# Patient Record
Sex: Male | Born: 1960 | Race: White | Hispanic: No | Marital: Single | Smoking: Former smoker
Health system: Southern US, Community
[De-identification: ages and names within clinical notes are randomized; demographics above are authoritative.]

## PROBLEM LIST (undated history)

## (undated) DIAGNOSIS — C719 Malignant neoplasm of brain, unspecified: Secondary | ICD-10-CM

## (undated) DIAGNOSIS — R51 Headache: Secondary | ICD-10-CM

## (undated) DIAGNOSIS — R4701 Aphasia: Secondary | ICD-10-CM

## (undated) DIAGNOSIS — I10 Essential (primary) hypertension: Secondary | ICD-10-CM

## (undated) DIAGNOSIS — K219 Gastro-esophageal reflux disease without esophagitis: Secondary | ICD-10-CM

## (undated) DIAGNOSIS — N200 Calculus of kidney: Secondary | ICD-10-CM

## (undated) HISTORY — PX: LUMBAR DISC SURGERY: SHX700

---

## 2003-04-15 ENCOUNTER — Encounter: Payer: Self-pay | Admitting: Orthopaedic Surgery

## 2003-04-15 ENCOUNTER — Ambulatory Visit (HOSPITAL_COMMUNITY): Admission: RE | Admit: 2003-04-15 | Discharge: 2003-04-15 | Payer: Self-pay | Admitting: Orthopaedic Surgery

## 2003-10-15 ENCOUNTER — Ambulatory Visit (HOSPITAL_COMMUNITY): Admission: RE | Admit: 2003-10-15 | Discharge: 2003-10-16 | Payer: Self-pay | Admitting: Orthopaedic Surgery

## 2009-09-03 ENCOUNTER — Emergency Department (HOSPITAL_COMMUNITY): Admission: EM | Admit: 2009-09-03 | Discharge: 2009-09-03 | Payer: Self-pay | Admitting: Emergency Medicine

## 2011-03-18 LAB — URINALYSIS, ROUTINE W REFLEX MICROSCOPIC
Bilirubin Urine: NEGATIVE
Glucose, UA: NEGATIVE mg/dL
Leukocytes, UA: NEGATIVE
Nitrite: NEGATIVE
Protein, ur: NEGATIVE mg/dL
Specific Gravity, Urine: 1.025 (ref 1.005–1.030)
Urobilinogen, UA: 0.2 mg/dL (ref 0.0–1.0)
pH: 6 (ref 5.0–8.0)

## 2011-03-18 LAB — POCT I-STAT, CHEM 8
BUN: 12 mg/dL (ref 6–23)
Calcium, Ion: 1.22 mmol/L (ref 1.12–1.32)
Chloride: 109 mEq/L (ref 96–112)
Creatinine, Ser: 1 mg/dL (ref 0.4–1.5)
Glucose, Bld: 103 mg/dL — ABNORMAL HIGH (ref 70–99)
HCT: 44 % (ref 39.0–52.0)
Hemoglobin: 15 g/dL (ref 13.0–17.0)
Potassium: 3.7 mEq/L (ref 3.5–5.1)
Sodium: 145 mEq/L (ref 135–145)
TCO2: 23 mmol/L (ref 0–100)

## 2011-03-18 LAB — CBC
HCT: 43.9 % (ref 39.0–52.0)
Hemoglobin: 15.3 g/dL (ref 13.0–17.0)
MCHC: 34.8 g/dL (ref 30.0–36.0)
MCV: 91.9 fL (ref 78.0–100.0)
Platelets: 207 10*3/uL (ref 150–400)
RBC: 4.78 MIL/uL (ref 4.22–5.81)
RDW: 12.4 % (ref 11.5–15.5)
WBC: 14.5 10*3/uL — ABNORMAL HIGH (ref 4.0–10.5)

## 2011-03-18 LAB — DIFFERENTIAL
Basophils Absolute: 0 10*3/uL (ref 0.0–0.1)
Basophils Relative: 0 % (ref 0–1)
Eosinophils Absolute: 0 10*3/uL (ref 0.0–0.7)
Eosinophils Relative: 0 % (ref 0–5)
Lymphocytes Relative: 6 % — ABNORMAL LOW (ref 12–46)
Lymphs Abs: 0.9 10*3/uL (ref 0.7–4.0)
Monocytes Absolute: 0.3 10*3/uL (ref 0.1–1.0)
Monocytes Relative: 2 % — ABNORMAL LOW (ref 3–12)
Neutro Abs: 13.3 10*3/uL — ABNORMAL HIGH (ref 1.7–7.7)
Neutrophils Relative %: 92 % — ABNORMAL HIGH (ref 43–77)

## 2011-03-18 LAB — URINE MICROSCOPIC-ADD ON

## 2011-04-29 NOTE — Op Note (Signed)
NAME:  JOB, HOLTSCLAW                           ACCOUNT NO.:  [id number]   MEDICAL RECORD NO.:  [id number]                   PATIENT TYPE:  OIB   LOCATION:  2899                                 FACILITY:  [facility]   PHYSICIAN:  Sharolyn Douglas, M.D.                     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   PROCEDURE:  Left L4-5 microdiskectomy and lateral recess decompression  utilizing the Maxcess retractor system.   SURGEON:  Sharolyn Douglas, M.D.   ASSISTANT:  Verlin Fester, P.A.   ANESTHESIA:  General endotracheal.   COMPLICATIONS:  None.   INDICATIONS:  The patient is a 50 year old male with persistent left L5  radiculopathy secondary to a disk herniation at L4-5 with associated lateral  recess narrowing.  Because his symptoms were refractory to all conservative  care, he elected to undergo microdecompression in hopes of improving his  symptoms.  The risks, benefits, alternatives were extensively discussed and  the patient elected to proceed.   PROCEDURE:  The patient was properly identified in the holding area and  taken to the operating room.  He underwent general endotracheal anesthesia  without difficulty.  He was given prophylactic antibiotics.  He was  carefully positioned on the Wilson frame.  All bony prominences were padded.  Face and eyes were protected at all times.  The back was prepped and draped  in the usual fashion.  We did not use Betadine because of an allergic  history.  Fluoroscopy was brought into the field and we identified the L4-5  level.  Using the lateral imaging, a 1.5 cm incision was made 2 cm off the  midline on the left side directly over the L4-5 disk space.  We then used a  series of dilators to spread the muscle.  We placed the Maxcess retractor.  We removed  a thin layer of muscle directly over the L4-5 interspace and  facet joint.  We again confirmed appropriate position of our retractor over  the L4-5 interspace using fluoroscopy.  The microscope was draped and  brought into the field.  The remainder of the operation was done under  direct microscopic illumination and magnification.   A high speed bur was used to remove the medial one-third of the facet joint  complex as well as the inferior one-third of the L4 lamina.  The ligamentum  flavum was elevated.  We identified the thecal sac and the L5 nerve root.  It was being displaced posteriorly by focal disk bulge.  The nerve root was  gently mobilized medially.  Bleeding was controlled with bipolar  electrocautery and Gelfoam.  The lateral recess was decompressed by removing  the ligamentum flavum  and superior facet overhang.  We then performed sharp  annulotomy.  Several small fragments of disk material were removed.  The  disk space was irrigated using Angiocath irrigation.   We then checked the nerve.  It was completely free from take-off out the  respective foramen using a blunt probe.  Bleeding was controlled with  Gelfoam.  We left 2 mL of Fentanyl in the epidural space for postoperative  anesthesia.  The deep fascia was closed with a #1 Vicryl.  The subcutaneous  layer was closed with 2-0 Vicryl followed by  Dermabond on the skin; 10 cc of local anesthetic was utilized in the  subcutaneous muscle and skin.  The patient was turned supine, extubated  without difficulty and transferred to the recovery room in stable condition,  able to move her upper and lower extremities.                                               Sharolyn Douglas, M.D.    MC/MEDQ  D:  10/15/2003  T:  10/16/2003  Job:  [id number]

## 2011-04-29 NOTE — H&P (Signed)
NAME:  Kevin Galloway, Kevin Galloway                           ACCOUNT NO.:  [id number]   MEDICAL RECORD NO.:  [id number]                   PATIENT TYPE:  OIB   LOCATION:  2899                                 FACILITY:  [facility]   PHYSICIAN:  Sharolyn Douglas, M.D.                     DATE OF BIRTH:  1961/09/27   DATE OF ADMISSION:  10/15/2003  DATE OF DISCHARGE:                                HISTORY & PHYSICAL   CHIEF COMPLAINT:  Back pain and severe left lower extremity pain.   HISTORY OF PRESENT ILLNESS:  The patient is a 50 year old male with back and  left lower extremity pain for approximately 10 months now.  Unfortunately,  his pain has been getting worse despite conservative measures including anti-  inflammatory medications, pain medications, activity modification, and  physical therapy.  His pain has got to the point that it is severely  affecting his activities of daily living and quality of life.  Secondary to  his physical exam findings as well as his MRI findings of HNP at L4/5, it is  felt that the best course of management would be a microdiscectomy.  Risks  and benefits of this procedure were discussed with the patient by Dr. Sharolyn Douglas as well as myself.  They indicated an understanding and opted to  proceed.   ALLERGIES:  PENICILLIN where he has an anaphylactic reaction.  SHELLFISH and  IODINE anaphylactic reaction.  DIMETAPP causes him to be hyper and crazy.   MEDICATIONS:  Tylenol p.r.n.   PAST MEDICAL HISTORY:  Healthy.   PAST SURGICAL HISTORY:  Hand surgery in 1999.   SOCIAL HISTORY:  The patient denies tobacco use and alcohol use.  He is  single.  He works as a Printmaker. His son-in-law will be available to help  during his postoperative course.   FAMILY HISTORY:  Mother alive at age 51 and healthy other than  osteoarthritis.  Father alive in his 85s with a history of TIA.  Otherwise  healthy.   REVIEW OF SYMPTOMS:  The patient denies fevers, chills, sweats, or bleeding  tendencies.  CNS:  Denies blurred vision or double vision, seizures,  headaches, or paralysis.  CARDIOVASCULAR:  Denies chest pain, orthopnea,  angina, claudication, or palpitations.  PULMONARY:  Denies shortness of  breath, productive cough, hemoptysis.  GASTROINTESTINAL:  Denies nausea or  vomiting, constipation, diarrhea, melena, or bloody stools. GENITOURINARY:  Denies dysuria, hematuria, or discharge.  MUSCULOSKELETAL:  As stated in  HPI.   PHYSICAL EXAMINATION:  VITAL SIGNS:  Blood pressure 122/88, respirations 16  and unlabored, pulses 80 and regular.  GENERAL:  The patient is a 50 year old white male who is alert and oriented  and in no acute distress.  Well-nourished and well-groomed.  Appears stated  age.  Pleasant and cooperative to exam.  HEENT:  Head is normocephalic and atraumatic.  Pupils are equal,  round, and  reactive.  Extraocular movements intact.  Nares patent.  Pharynx clear.  NECK:  Supple.  No lymphadenopathy, thyromegaly, or bruits appreciated.  CHEST:  Clear to auscultation bilaterally.  No rales, rhonchi, stridor,  wheezes, or friction rubs.  BREASTS:  Not pertinent and not performed.  HEART:  S1 and S2.  Regular rate and rhythm.  No murmurs, rubs, or gallops  noted.  ABDOMEN:  Soft to palpation.  Positive bowel sounds.  Nontender and  nondistended.  No organomegaly noted.  GENITOURINARY:  Not pertinent and not performed.  EXTREMITIES:  The patient has left lower extremity pain.  His motor function  and sensation are grossly intact.  Pulses are intact and symmetric  bilaterally.  SKIN:  Intact without any lesions or rashes.   LABORATORY DATA:  MRI shows HNP at L4/5.   IMPRESSION:  Herniated nucleus pulposus on lumbar vertebrae-4/5 with left  lumbar vertebrae-5 radiculopathy.   PLAN:  Admit to [facility] on October 15, 2003 for L4/5  microdiscectomy on the left.  This will be done by Dr. Sharolyn Douglas.      Verlin Fester, P.A.                        Sharolyn Douglas, M.D.    CM/MEDQ  D:  10/15/2003  T:  10/15/2003  Job:  [id number]

## 2012-02-13 ENCOUNTER — Ambulatory Visit (INDEPENDENT_AMBULATORY_CARE_PROVIDER_SITE_OTHER): Payer: BC Managed Care – PPO | Admitting: Internal Medicine

## 2012-02-13 VITALS — BP 141/80 | HR 56 | Temp 97.8°F | Resp 16 | Ht 71.0 in | Wt 194.0 lb

## 2012-02-13 DIAGNOSIS — J329 Chronic sinusitis, unspecified: Secondary | ICD-10-CM

## 2012-02-13 DIAGNOSIS — R439 Unspecified disturbances of smell and taste: Secondary | ICD-10-CM

## 2012-02-13 MED ORDER — FLUTICASONE PROPIONATE 50 MCG/ACT NA SUSP: 2.0000 | Freq: Every day | NASAL | Status: DC

## 2012-02-13 MED ORDER — AZITHROMYCIN 250 MG PO TABS: ORAL_TABLET | ORAL | Status: AC

## 2012-02-13 NOTE — Patient Instructions (Signed)

## 2012-02-13 NOTE — Progress Notes (Signed)
  Subjective:    Patient ID: Kevin Galloway, male    DOB: 1961/07/22, 51 y.o.   MRN: [id number]  HPI 51 y/o WM presents complaining of changing sense of smell.  Even perfumes smell like "garbage and bitter".  Denies sinus pressure, no nasal drainage, no ear pain, no fever.  No recent head injury.  No dizziness, no vision changes, no headaches.  History of vertigo.    Also complaining of memory issues.  Can't remember songs like he used to.  Difficulty with word finding.  Trouble with dates.  Aware that he forgets things from time to time.  No family history of Alzheimer's disease or other memory disturbance.    Sleeps 12 hours nightly.  Lives with brother and his wife.      Review of Systems  Constitutional: Negative for fever, chills and fatigue.  HENT: Negative for hearing loss, ear pain, congestion, rhinorrhea, sinus pressure and tinnitus.        Positive for changing sense of smell.  No anosmia.  Neurological: Negative for dizziness, tremors, syncope, light-headedness, numbness and headaches.       Objective:   Physical Exam  Constitutional: He is oriented to person, place, and time. He appears well-developed and well-nourished.  HENT:  Head: Normocephalic and atraumatic.  Right Ear: External ear normal.  Left Ear: External ear normal.       No air/fluid levels  Eyes: Conjunctivae and EOM are normal. Pupils are equal, round, and reactive to light.  Neck: Normal range of motion. Neck supple.  Cardiovascular: Normal rate and regular rhythm.   Pulmonary/Chest: Effort normal and breath sounds normal.  Neurological: He is alert and oriented to person, place, and time.       CN II-XI intact          Assessment & Plan:  Dysosmia.  Likely due to underlying sinusitis triggerred by mold during leaf raking earlier this year.  No concerning or focal signs on examination.  Will treat with antibiotics for 10 days and Flonase for 3 weeks.  If no improvement in symptoms at that time, consider  CT scan of his head to evaluate for mass lesion causing both his dysosmia and memory difficulties.  Memory disturbance.  Likely due to dealing with many tasks at once.  Advised he purchase the book "Where did I leave my glasses?" to read.  He is hyper-aware of his memory disturbance, and can remember that he forgets things from time to time.  We discussed this as a reassuring symptom.   If symptoms persist, or worsen rapidly, he is to return for evaluation.    Discussed and reviewed with Dr. Merla Riches.  E.Ashelyn Mccravy PA-C

## 2012-02-15 ENCOUNTER — Ambulatory Visit (INDEPENDENT_AMBULATORY_CARE_PROVIDER_SITE_OTHER): Payer: BC Managed Care – PPO | Admitting: Family Medicine

## 2012-02-15 DIAGNOSIS — J329 Chronic sinusitis, unspecified: Secondary | ICD-10-CM

## 2012-02-15 DIAGNOSIS — F411 Generalized anxiety disorder: Secondary | ICD-10-CM

## 2012-02-15 DIAGNOSIS — F419 Anxiety disorder, unspecified: Secondary | ICD-10-CM

## 2012-02-15 DIAGNOSIS — D518 Other vitamin B12 deficiency anemias: Secondary | ICD-10-CM

## 2012-02-15 DIAGNOSIS — Z789 Other specified health status: Secondary | ICD-10-CM

## 2012-02-15 DIAGNOSIS — R439 Unspecified disturbances of smell and taste: Secondary | ICD-10-CM

## 2012-02-15 DIAGNOSIS — T887XXA Unspecified adverse effect of drug or medicament, initial encounter: Secondary | ICD-10-CM

## 2012-02-15 DIAGNOSIS — G43909 Migraine, unspecified, not intractable, without status migrainosus: Secondary | ICD-10-CM

## 2012-02-15 NOTE — Progress Notes (Signed)
Subjective: 51 year old white male who was here a couple of days ago when problems from an abnormal smell sensation. He had started a couple weeks ago with a lot of salivation, then developed this abnormal smell. He was seen by the PA and treated with Flonase and Zithromax. He started these yesterday. Last night he developed a severe headache which is unusual for him. He had some flashing light type sensation suggests a severe headache then persisted into the night. He continues to feel washed out today. 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 Not on any other regular medications. He has a history of having had a reaction to a number of meds.  Objective: Eyes PERRLA TMs normal throat clear tiny bit of blood on the mucous inside his left nares neck was supple without significant nodes no carotid bruits chest was clear to auscultation heart regular without murmurs motor strength symmetrical. No gross neurologic deficits noted. The patient is alert and oriented.Very anxious.  Assessment: Headache (probably a migraine variant) which could well be triggered by the Zithromax or Flonase, more likely the Flonase. Abnormal smell sensation, probably related to a sinusitis  Plan: He is best to leave him off of medicines for the most part today. Suggest she use either nasal saline several times daily or if he is feeling more congested in his nose, which is not right now, try using a nettie pot He feels being busy at work best for him , not taking the day off

## 2012-02-15 NOTE — Patient Instructions (Addendum)
Hold the fluticasone and azithromycin. Use NaSal or some other nasal saline spray available over-the-counter.   Try to give it a few days.. If he's not feeling like he is doing better he is to return. In the meanwhile we are trying to get him an appointment with an ENT doctor.   Take 2 tylenol every six hours as needed for headache or pain

## 2012-02-23 ENCOUNTER — Encounter (HOSPITAL_COMMUNITY): Payer: Self-pay | Admitting: *Deleted

## 2012-02-23 ENCOUNTER — Ambulatory Visit (HOSPITAL_COMMUNITY)
Admission: RE | Admit: 2012-02-23 | Discharge: 2012-02-23 | Disposition: A | Discharge status: Home / Self Care | Payer: BC Managed Care – PPO | Source: Ambulatory Visit | Attending: Family Medicine | Admitting: Family Medicine

## 2012-02-23 ENCOUNTER — Ambulatory Visit (INDEPENDENT_AMBULATORY_CARE_PROVIDER_SITE_OTHER): Payer: BC Managed Care – PPO | Admitting: Family Medicine

## 2012-02-23 ENCOUNTER — Inpatient Hospital Stay (HOSPITAL_COMMUNITY)
Admission: EM | Admit: 2012-02-23 | Discharge: 2012-02-28 | DRG: 017 | Disposition: A | Discharge status: Home / Self Care | Payer: BC Managed Care – PPO | Source: Ambulatory Visit | Attending: Neurological Surgery | Admitting: Neurological Surgery

## 2012-02-23 VITALS — BP 126/84 | HR 66 | Temp 98.5°F | Resp 16 | Ht 71.0 in | Wt 191.6 lb

## 2012-02-23 DIAGNOSIS — R471 Dysarthria and anarthria: Secondary | ICD-10-CM | POA: Insufficient documentation

## 2012-02-23 DIAGNOSIS — G9389 Other specified disorders of brain: Secondary | ICD-10-CM | POA: Insufficient documentation

## 2012-02-23 DIAGNOSIS — R430 Anosmia: Secondary | ICD-10-CM

## 2012-02-23 DIAGNOSIS — R4789 Other speech disturbances: Secondary | ICD-10-CM | POA: Diagnosis present

## 2012-02-23 DIAGNOSIS — R439 Unspecified disturbances of smell and taste: Secondary | ICD-10-CM

## 2012-02-23 DIAGNOSIS — R413 Other amnesia: Secondary | ICD-10-CM | POA: Diagnosis present

## 2012-02-23 DIAGNOSIS — J329 Chronic sinusitis, unspecified: Secondary | ICD-10-CM

## 2012-02-23 HISTORY — DX: Malignant neoplasm of brain, unspecified: C71.9

## 2012-02-23 HISTORY — DX: Aphasia: R47.01

## 2012-02-23 HISTORY — DX: Headache: R51

## 2012-02-23 HISTORY — DX: Gastro-esophageal reflux disease without esophagitis: K21.9

## 2012-02-23 HISTORY — DX: Calculus of kidney: N20.0

## 2012-02-23 HISTORY — DX: Essential (primary) hypertension: I10

## 2012-02-23 LAB — POCT CBC
Granulocyte percent: 65.2 %G (ref 37–80)
HCT, POC: 40.7 % — AB (ref 43.5–53.7)
Hemoglobin: 13.5 g/dL — AB (ref 14.1–18.1)
Lymph, poc: 1.9 (ref 0.6–3.4)
MCH, POC: 30.3 pg (ref 27–31.2)
MCHC: 33.2 g/dL (ref 31.8–35.4)
MCV: 91.3 fL (ref 80–97)
MID (cbc): 0.4 (ref 0–0.9)
MPV: 10.3 fL (ref 0–99.8)
POC Granulocyte: 4.2 (ref 2–6.9)
POC LYMPH PERCENT: 29.2 %L (ref 10–50)
POC MID %: 5.6 %M (ref 0–12)
Platelet Count, POC: 192 10*3/uL (ref 142–424)
RBC: 4.46 M/uL — AB (ref 4.69–6.13)
RDW, POC: 13 %
WBC: 6.4 10*3/uL (ref 4.6–10.2)

## 2012-02-23 LAB — POCT SEDIMENTATION RATE: POCT SED RATE: 6 mm/hr (ref 0–22)

## 2012-02-23 MED ORDER — GADOBENATE DIMEGLUMINE 529 MG/ML IV SOLN
18.0000 mL/kg | Freq: Once | INTRAVENOUS | Status: AC
  Administered 2012-02-23: 18 mL via INTRAVENOUS

## 2012-02-23 NOTE — ED Notes (Signed)
The pt does not know why hes here.  He was seen by a doctor today and sent for a c-t scan then back to his doctors office and he was told he has something in his lt head.  No pain he appears slow to answer.

## 2012-02-23 NOTE — Progress Notes (Addendum)
  Subjective:    Patient ID: Kevin Galloway, male    DOB: 1961-11-28, 51 y.o.   MRN: [id number]  HPI  Patient presents in follow up(see office visits of 3/4 and 3/6)  Patient states he began to hyper salivate beginning in January.  This would occur in bursts. Symptoms stopped 2-3 weeks ago after which he developed an unusual scent in his nostrils Patient treated for sinusitis however only took 1 does of azithromycin and flonase  Approximately 10 days ago developed dysarthria which has persisted  Former smoker; quit 1990(smoked 5-6 years); chews tobacco and uses snuff Drinks copious water  SH/ Surveyer  Review of Systems  Constitutional: Negative for fever and chills.  HENT: Negative for neck pain, postnasal drip and ear discharge.   Neurological: Positive for speech difficulty. Negative for dizziness, facial asymmetry and headaches.       Memory loss       Objective:   Physical Exam  Constitutional: He appears well-developed and well-nourished.  HENT:  Head: Normocephalic and atraumatic.  Right Ear: External ear normal.  Left Ear: External ear normal.  Nose: Nose normal.  Mouth/Throat: Oropharynx is clear and moist.  Eyes: EOM are normal. Pupils are equal, round, and reactive to light.  Neck: Neck supple.  Cardiovascular: Normal rate, regular rhythm and normal heart sounds.   Pulmonary/Chest: Effort normal and breath sounds normal.  Neurological: He is alert. He has normal strength. No cranial nerve deficit or sensory deficit.       Dysarthric speech Difficulty with word finding  Skin: Skin is warm.      Results for orders placed in visit on 02/23/12  POCT CBC      Component Value Range   WBC 6.4  4.6 - 10.2 (K/uL)   Lymph, poc 1.9  0.6 - 3.4    POC LYMPH PERCENT 29.2  10 - 50 (%L)   MID (cbc) 0.4  0 - 0.9    POC MID % 5.6  0 - 12 (%M)   POC Granulocyte 4.2  2 - 6.9    Granulocyte percent 65.2  37 - 80 (%G)   RBC 4.46 (*) 4.69 - 6.13 (M/uL)   Hemoglobin 13.5 (*)  14.1 - 18.1 (g/dL)   HCT, POC 65.7 (*) 84.6 - 53.7 (%)   MCV 91.3  80 - 97 (fL)   MCH, POC 30.3  27 - 31.2 (pg)   MCHC 33.2  31.8 - 35.4 (g/dL)   RDW, POC 96.2     Platelet Count, POC 192  142 - 424 (K/uL)   MPV 10.3  0 - 99.8 (fL)  POCT SEDIMENTATION RATE      Component Value Range   POCT SED RATE 6  0 - 22 (mm/hr)       Assessment & Plan:   1. Sinusitis  POCT CBC  2. Dysarthria  POCT SEDIMENTATION RATE, Comprehensive metabolic panel, MR Brain W Contrast, MR Brain W Wo Contrast  3. Anosmia  MR Brain W Contrast, MR Brain W Wo Contrast   Patient to follow up after MRI brain  Call from Dr. 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 Rubin Payor accepted patient in transfer

## 2012-02-24 ENCOUNTER — Encounter (HOSPITAL_COMMUNITY): Payer: Self-pay | Admitting: Emergency Medicine

## 2012-02-24 DIAGNOSIS — C719 Malignant neoplasm of brain, unspecified: Secondary | ICD-10-CM

## 2012-02-24 HISTORY — DX: Malignant neoplasm of brain, unspecified: C71.9

## 2012-02-24 LAB — COMPREHENSIVE METABOLIC PANEL
ALT: 23 U/L (ref 0–53)
AST: 25 U/L (ref 0–37)
Albumin: 4.8 g/dL (ref 3.5–5.2)
Alkaline Phosphatase: 83 U/L (ref 39–117)
BUN: 16 mg/dL (ref 6–23)
CO2: 24 mEq/L (ref 19–32)
Calcium: 10.1 mg/dL (ref 8.4–10.5)
Chloride: 104 mEq/L (ref 96–112)
Creat: 1.26 mg/dL (ref 0.50–1.35)
Glucose, Bld: 107 mg/dL — ABNORMAL HIGH (ref 70–99)
Potassium: 4.5 mEq/L (ref 3.5–5.3)
Sodium: 140 mEq/L (ref 135–145)
Total Bilirubin: 0.6 mg/dL (ref 0.3–1.2)
Total Protein: 6.9 g/dL (ref 6.0–8.3)

## 2012-02-24 LAB — CBC
HCT: 44.1 % (ref 39.0–52.0)
Hemoglobin: 16 g/dL (ref 13.0–17.0)
MCH: 31.6 pg (ref 26.0–34.0)
MCHC: 36.3 g/dL — ABNORMAL HIGH (ref 30.0–36.0)
MCV: 87.2 fL (ref 78.0–100.0)
Platelets: 208 10*3/uL (ref 150–400)
RBC: 5.06 MIL/uL (ref 4.22–5.81)
RDW: 12.2 % (ref 11.5–15.5)
WBC: 8.9 10*3/uL (ref 4.0–10.5)

## 2012-02-24 LAB — DIFFERENTIAL
Basophils Absolute: 0 10*3/uL (ref 0.0–0.1)
Basophils Relative: 0 % (ref 0–1)
Eosinophils Absolute: 0.1 10*3/uL (ref 0.0–0.7)
Eosinophils Relative: 1 % (ref 0–5)
Lymphocytes Relative: 42 % (ref 12–46)
Lymphs Abs: 3.7 10*3/uL (ref 0.7–4.0)
Monocytes Absolute: 0.5 10*3/uL (ref 0.1–1.0)
Monocytes Relative: 5 % (ref 3–12)
Neutro Abs: 4.6 10*3/uL (ref 1.7–7.7)
Neutrophils Relative %: 52 % (ref 43–77)

## 2012-02-24 LAB — BASIC METABOLIC PANEL
BUN: 16 mg/dL (ref 6–23)
CO2: 25 mEq/L (ref 19–32)
Calcium: 10.1 mg/dL (ref 8.4–10.5)
Chloride: 100 mEq/L (ref 96–112)
Creatinine, Ser: 0.99 mg/dL (ref 0.50–1.35)
GFR calc Af Amer: >90 mL/min (ref >90)
GFR calc non Af Amer: >90 mL/min (ref >90)
Glucose, Bld: 107 mg/dL — ABNORMAL HIGH (ref 70–99)
Potassium: 3.6 mEq/L (ref 3.5–5.1)
Sodium: 136 mEq/L (ref 135–145)

## 2012-02-24 MED ORDER — SODIUM CHLORIDE 0.9 % IJ SOLN
3.0000 mL | INTRAMUSCULAR | Status: DC | PRN
  Administered 2012-02-24 – 2012-02-25 (×2): 3 mL via INTRAVENOUS

## 2012-02-24 MED ORDER — PANTOPRAZOLE SODIUM 40 MG PO TBEC
40.0000 mg | DELAYED_RELEASE_TABLET | Freq: Every day | ORAL | Status: DC
  Administered 2012-02-24 – 2012-02-28 (×5): 40 mg via ORAL
  Filled 2012-02-24 (×4): qty 1

## 2012-02-24 MED ORDER — SODIUM CHLORIDE 0.9 % IV SOLN
250.0000 mL | INTRAVENOUS | Status: DC
  Administered 2012-02-24: 250 mL via INTRAVENOUS

## 2012-02-24 MED ORDER — ACETAMINOPHEN 650 MG RE SUPP: 650.0000 mg | RECTAL | Status: DC | PRN

## 2012-02-24 MED ORDER — HYDROMORPHONE HCL PF 1 MG/ML IJ SOLN: 0.5000 mg | INTRAMUSCULAR | Status: DC | PRN

## 2012-02-24 MED ORDER — POTASSIUM CHLORIDE IN NACL 20-0.9 MEQ/L-% IV SOLN
INTRAVENOUS | Status: DC
  Administered 2012-02-24 – 2012-02-25 (×3): via INTRAVENOUS
  Administered 2012-02-26: 1000 mL via INTRAVENOUS
  Administered 2012-02-26 – 2012-02-27 (×3): via INTRAVENOUS
  Filled 2012-02-24 (×10): qty 1000

## 2012-02-24 MED ORDER — DEXAMETHASONE SODIUM PHOSPHATE 4 MG/ML IJ SOLN
4.0000 mg | Freq: Four times a day (QID) | INTRAMUSCULAR | Status: AC
  Administered 2012-02-24 – 2012-02-25 (×4): 4 mg via INTRAVENOUS
  Filled 2012-02-24 (×2): qty 1

## 2012-02-24 MED ORDER — ACETAMINOPHEN 325 MG PO TABS: 650.0000 mg | ORAL_TABLET | ORAL | Status: DC | PRN

## 2012-02-24 MED ORDER — HYDROCODONE-ACETAMINOPHEN 5-325 MG PO TABS: 1.0000 | ORAL_TABLET | ORAL | Status: DC | PRN

## 2012-02-24 MED ORDER — ONDANSETRON HCL 4 MG/2ML IJ SOLN: 4.0000 mg | INTRAMUSCULAR | Status: DC | PRN

## 2012-02-24 MED ORDER — SODIUM CHLORIDE 0.9 % IJ SOLN
3.0000 mL | Freq: Two times a day (BID) | INTRAMUSCULAR | Status: DC
  Administered 2012-02-24 – 2012-02-27 (×5): 3 mL via INTRAVENOUS

## 2012-02-24 NOTE — ED Notes (Signed)
MD at bedside for pt exam.

## 2012-02-24 NOTE — ED Provider Notes (Signed)
History     CSN: [id number]  Arrival date & time 02/23/12  2255   First MD Initiated Contact with Patient 02/24/12 0242      Chief Complaint  Patient presents with  . Headache    (Consider location/radiation/quality/duration/timing/severity/associated sxs/prior treatment) HPI Comments: Patient presents complaining of approximately 2 weeks of word finding difficulties.  He does not have a primary care physician.  Patient went to an urgent care where he was referred for an MRI.  After receiving the results of the MRI the patient was directed here to the emergency department.  Patient was aware that he had some kind of brain tumor but no further information.  Patient denies any ongoing headache or nausea.  No fevers or chills.  He denies any weakness, numbness, dysarthria or other neurologic deficits.  Patient is a 51 y.o. male presenting with headaches. The history is provided by the patient. No language interpreter was used.  Headache  Pertinent negatives include no fever, no shortness of breath, no nausea and no vomiting.    History reviewed. No pertinent past medical history.  History reviewed. No pertinent past surgical history.  History reviewed. No pertinent family history.  History  Substance Use Topics  . Smoking status: Former Research scientist (life sciences)  . Smokeless tobacco: Not on file  . Alcohol Use: Not on file      Review of Systems  Constitutional: Negative.  Negative for fever and chills.  Eyes: Negative.  Negative for discharge and redness.  Respiratory: Negative.  Negative for cough and shortness of breath.   Cardiovascular: Negative.  Negative for chest pain.  Gastrointestinal: Negative.  Negative for nausea, vomiting and abdominal pain.  Genitourinary: Negative.  Negative for hematuria.  Musculoskeletal: Negative.  Negative for back pain.  Skin: Negative.  Negative for color change and rash.  Neurological: Negative for syncope, speech difficulty and headaches.   Expressive aphasia  Hematological: Negative.  Negative for adenopathy.  Psychiatric/Behavioral: Negative.  Negative for confusion.  All other systems reviewed and are negative.    Allergies  Iodine; Penicillins; Shellfish allergy; Dimetapp; Doxycycline; Latex; and Oxycodone  Home Medications   Current Outpatient Rx  Name Route Sig Dispense Refill  . ACETAMINOPHEN 325 MG PO TABS Oral Take 650 mg by mouth every 6 (six) hours as needed. For pain    . SODIUM CHLORIDE 0.65 % NA SOLN Nasal Place 1 spray into the nose daily as needed. For congestion      BP 109/71  Pulse 52  Temp(Src) 97.2 F (36.2 C) (Oral)  Resp 16  SpO2 94%  Physical Exam  Nursing note and vitals reviewed. Constitutional: He is oriented to person, place, and time. He appears well-developed and well-nourished.  Non-toxic appearance. He does not have a sickly appearance.  HENT:  Head: Normocephalic and atraumatic.  Eyes: Conjunctivae, EOM and lids are normal. Pupils are equal, round, and reactive to light.  Neck: Trachea normal, normal range of motion and full passive range of motion without pain. Neck supple.  Cardiovascular: Normal rate, regular rhythm and normal heart sounds.   Pulmonary/Chest: Effort normal and breath sounds normal. No respiratory distress.  Abdominal: Soft. Normal appearance. He exhibits no distension. There is no tenderness. There is no rebound and no CVA tenderness.  Musculoskeletal: Normal range of motion.  Neurological: He is alert and oriented to person, place, and time. He has normal strength.       Cranial nerves II through XII are intact.  Face is symmetric.  Tongue is midline.  Visual fields are intact.  Extraocular eye movements are intact.  No pronator drift.  Normal finger nose testing bilaterally.  Clear speech.  Normal gait.  Patient does have some expressive aphasia in difficulty finding specific words in conversation.  Skin: Skin is warm, dry and intact. No rash noted.    Psychiatric: He has a normal mood and affect. His behavior is normal. Judgment and thought content normal.    ED Course  Procedures (including critical care time)  Labs Reviewed  CBC - Abnormal; Notable for the following:    MCHC 36.3 (*)    All other components within normal limits  BASIC METABOLIC PANEL - Abnormal; Notable for the following:    Glucose, Bld 107 (*)    All other components within normal limits  DIFFERENTIAL   Mr Jeri Cos Wo Contrast  02/23/2012  *RADIOLOGY REPORT*  Clinical Data: Dysarthria.  MRI HEAD WITHOUT AND WITH CONTRAST  Technique:  Multiplanar, multiecho pulse sequences of the brain and surrounding structures were obtained according to standard protocol without and with intravenous contrast  Contrast:  15 ml Multihance  Comparison: None.  Findings: There is a 6 cm mass lesion in the left temporal lobe with areas of enhancement, internal necrosis and internal hemorrhage.  There is regional vasogenic edema.  There is mass effect upon the left lateral ventricle.  There is left to right shift of 8 mm.  There is uncal herniation on the left.  Findings are consistent with malignant glioma, probably glioblastoma multiforme.  No separate or satellite foci are evident.  The right hemisphere is normal.  No hydrocephalus.  No extra-axial fluid collection. No ischemic infarction.  No pituitary mass.  No inflammatory sinus disease.  IMPRESSION: 6 cm mass lesion in the left temporal lobe with mass effect, vasogenic edema, left right shift and early uncal herniation. 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  Critical Value/emergent results were called by telephone at the time of interpretation on 02/23/2012  at 2130 hours   to Dr.Richter, who verbally acknowledged these results.  Original Report Authenticated By: Jules Schick, M.D.     1. Brain mass       MDM  Patient awake and alert with his only neurologic deficits pain work finding difficulties.  I did contact Dr.  Ronnald Ramp from neurosurgery to evaluate the patient given his significant brain mass that is likely glioblastoma.  I did have discussion with the patient regarding his likely diagnosis and that his prognosis is not good.  I informed him that I would be involving neurosurgery and potentially other specialties to sort out a management plan since this patient has no primary care physician as he has no other medical problems at this time.  Dr. Ronnald Ramp did see and evaluate the patient and is admitting him to his service for further management at this time.        Lezlie Octave, MD 02/24/12 [phone]

## 2012-02-24 NOTE — ED Notes (Signed)
Patient stated that he has seen his doctor and he ordered MRI. He did MRI and they sent him straight here after they found a mass of 6cm in his temporal lobe. Patient stated he went to his doctor because his speech was affected by speaking slower and having hard time finding the words to express. Patient denies any pain and is ambulatory with steady gait. He thinks when he tries to find the right words to express and is slow to respond. No other neurodeficits noted

## 2012-02-24 NOTE — H&P (Signed)
Subjective: Patient is a 51 y.o. right handed male admitted with an abnormal brain lesion. Patient's symptoms include affective and/or cognitive changes including: word finding difficulties. Onset of symptoms was gradual starting a few weeks ago with gradually worsening course since that time. He denies headache or numbness tingling or weakness. At first it was felt he had sinus disease and he was given a Z-Pak it appears. He only took the first day. The history is difficult because of his difficulty with word finding. MRI was finally ordered today and he was found to have a left temporal enhancing brain lesion, and he was sent to [place] for further care. Neurosurgical evaluation was requested by the ED P.  History reviewed. No pertinent past medical history.  History reviewed. No pertinent past surgical history.  Allergies  Allergen Reactions  . Iodine Anaphylaxis and Hives    Reaction to shellfish  . Penicillins Anaphylaxis  . Shellfish Allergy Anaphylaxis and Hives  . Dimetapp (Albertsons Di Bromm) Other (See Comments)    Nightmare hallucination   . Doxycycline Nausea And Vomiting  . Latex Itching  . Oxycodone Other (See Comments)    Hallucinations     History  Substance Use Topics  . Smoking status: Former Research scientist (life sciences)  . Smokeless tobacco: Not on file  . Alcohol Use: Not on file    History reviewed. No pertinent family history. Prior to Admission medications   Medication Sig Start Date End Date Taking? Authorizing Provider  acetaminophen (TYLENOL) 325 MG tablet Take 650 mg by mouth every 6 (six) hours as needed. For pain   Yes Historical Provider, MD  sodium chloride (OCEAN) 0.65 % nasal spray Place 1 spray into the nose daily as needed. For congestion   Yes Historical Provider, MD     Review of Systems  Positive ROS: Otherwise negative  All other systems have been reviewed and were otherwise negative with the exception of those mentioned in the HPI and as  above.  Objective: Vital signs in last 24 hours: Temp:  [97.2 F (36.2 C)-98.5 F (36.9 C)] 97.2 F (36.2 C) (03/15 0346) Pulse Rate:  [52-80] 52  (03/15 0346) Resp:  [16] 16  (03/15 0346) BP: (109-139)/(71-88) 109/71 mmHg (03/15 0346) SpO2:  [94 %-95 %] 94 % (03/15 0346) Weight:  [86.909 kg (191 lb 9.6 oz)] 86.909 kg (191 lb 9.6 oz) (03/14 1733)  General Appearance: Alert, cooperative, no distress, appears stated age Head: Normocephalic, without obvious abnormality, atraumatic Eyes: PERRL, conjunctiva/corneas clear, EOM's intact, fundi benign, both eyes      Ears: Normal TM's and external ear canals, both ears Throat: Lips, mucosa, and tongue normal; teeth and gums normal Neck: Supple, symmetrical, trachea midline,  Back: Symmetric, no curvature, ROM normal, no CVA tenderness Lungs: Clear to auscultation bilaterally, respirations unlabored Heart: Regular rate and rhythm, S1 and S2 normal, no murmur, rub or gallop Abdomen: Soft, non-tender, bowel sounds active all four quadrants, no masses, no organomegaly Extremities: Extremities normal, atraumatic, no cyanosis or edema Pulses: 2+ and symmetric all extremities Skin: Skin color, texture, turgor normal, no rashes or lesions  NEUROLOGIC:  Mental status: Awake and alert, conversant but does have some trouble with word finding and mild fluency issues, no trouble with naming or repeating or with comprehension Motor Exam - grossly normal, normal tone/ bulk, no pronator drift Sensory Exam - grossly normal Reflexes: 2+ symmetric, no hoffman's or clonus Coordination - grossly normal Gait - grossly normal Balance - grossly normal Cranial Nerves: I: smell  Not tested  II: visual acuity  OS: nl    OD: nl  II: visual fields Full to confrontation  II: pupils Equal, round, reactive to light  III,VII: ptosis None  III,IV,VI: extraocular muscles  Full ROM  V: mastication Normal  V: facial light touch sensation  Normal  V,VII: corneal  reflex  Present  VII: facial muscle function - upper  Normal  VII: facial muscle function - lower Normal  VIII: hearing Not tested  IX: soft palate elevation  Normal  IX,X: gag reflex Present  XI: trapezius strength  5/5  XI: sternocleidomastoid strength 5/5  XI: neck flexion strength  5/5  XII: tongue strength  Normal    Data Review Lab Results  Component Value Date   WBC 8.9 02/24/2012   HGB 16.0 02/24/2012   HCT 44.1 02/24/2012   MCV 87.2 02/24/2012   PLT 208 02/24/2012   Lab Results  Component Value Date   NA 136 02/24/2012   K 3.6 02/24/2012   CL 100 02/24/2012   CO2 25 02/24/2012   BUN 16 02/24/2012   CREATININE 0.99 02/24/2012   GLUCOSE 107* 02/24/2012   No results found for this basename: INR, PROTIME    Assessment/Plan: Brain lesion (348.8), most consistent with either a GBM or lymphoma. This is a large 6 cm lesion in the dominant temporal lobe unfortunately involves the root of the temporal lobe and goes medial toward the basal ganglia. It is unlikely that this lesion can be completely resected without neurologic deficit including significant potential for aphasia and right hemiparesis. It is unlikely that surgical resection can be curative because he cannot be complete, and it may be that he is not a great surgical candidate and would be best treated with palliative radiation therapy and chemotherapy. I will obtain the opinions of other neurosurgeons.  Consults:radiation oncology will likely be necessary . Testing: PT. Dexamethasone to prevent and decrease any existing swelling of the brain.  I explained the condition and potential surgical and medical options to the patient and answered any questions.    Linell Shawn S 02/24/2012 3:56 AM

## 2012-02-25 ENCOUNTER — Telehealth: Payer: Self-pay

## 2012-02-25 LAB — PROTIME-INR: Prothrombin Time: 13.4 seconds (ref 11.6–15.2)

## 2012-02-25 MED ORDER — DEXAMETHASONE SODIUM PHOSPHATE 4 MG/ML IJ SOLN
6.0000 mg | Freq: Four times a day (QID) | INTRAMUSCULAR | Status: AC
  Administered 2012-02-25 – 2012-02-26 (×3): 6 mg via INTRAVENOUS
  Filled 2012-02-25: qty 2
  Filled 2012-02-25 (×2): qty 1.5

## 2012-02-25 NOTE — Telephone Encounter (Signed)
Pt is wanting to talk with dr Hal Hope she saw him recently and now he is in the hospital -please call [phone]

## 2012-02-25 NOTE — Telephone Encounter (Signed)
To Dr. Hal Hope

## 2012-02-25 NOTE — Progress Notes (Signed)
Filed Vitals:   02/24/12 2200 02/25/12 0200 02/25/12 0600 02/25/12 1004  BP: 107/60 112/70 114/71 155/79  Pulse: 60 68 53 76  Temp: 97.8 F (36.6 C) 97.3 F (36.3 C) 97.8 F (36.6 C) 97.6 F (36.4 C)  TempSrc: Oral Oral Oral Oral  Resp: 20 20 20 19   Height:      Weight:      SpO2: 96% 97% 96% 93%    CBC  Basename 02/24/12 0300 02/23/12 1751  WBC 8.9 6.4  HGB 16.0 13.5*  HCT 44.1 40.7*  PLT 208 --   BMET  Basename 02/24/12 0300 02/23/12 1816  NA 136 140  K 3.6 4.5  CL 100 104  CO2 25 24  GLUCOSE 107* 107*  BUN 16 16  CREATININE 0.99 1.26  CALCIUM 10.1 10.1    Patient resting comfortably in bed. He denies headache. His greatest complaint is his speech and language difficulties. However his mother notes that he does have difficulties with his memory as well.  On exam the patient is awake and alert following commands, moving all extremities well. He has significant expressive language difficulties, but does seem to understand well.   Plan: I met with the patient and his family including his mother, brother's, sister-in-law and other family members. I spoke with them for over 15 minutes about the nature of his condition, the findings of his MRI scan, and possible options for treatment and care. I emphasized to them that final recommendations will need to be made between the patient, his family, and Dr. Sherley Bounds his treating physician, and I emphasized to them that I am covering for Dr. Ronnald Ramp over this weekend but in the end treatment decisions will need to be made by him in the coming week. I did discuss with them the probable primary nature of this tumor, and the likelihood that this represents a GBM. We discussed options for treatment ranging from biopsy to surgical resection as well as adjuvant treatments of radiation therapy and chemotherapy. Their many questions were answered for them.  Hosie Spangle, MD 02/25/2012, 11:57 AM

## 2012-02-26 NOTE — Progress Notes (Signed)
Filed Vitals:   02/26/12 0113 02/26/12 0328 02/26/12 0633 02/26/12 1024  BP: 111/65 112/75 98/57 114/71  Pulse: 51 55 67 67  Temp: 97.9 F (36.6 C) 97.4 F (36.3 C) 97.7 F (36.5 C) 97.9 F (36.6 C)  TempSrc: Oral Oral Oral Oral  Resp: 16 16 16 18   Height:      Weight:      SpO2: 96% 95% 97% 94%    Patient resting comfortably in bed. Continues to have speech and language difficulty.  On exam he is awake and alert, following commands, moving all extremities well. He does continue to have expressive language difficulty.  Plan: I've encouraged the patient along with his family to have him including the halls at least 4-5 times per day. He is looking forward to discussing tomorrow with Dr. Yetta Barre plans for further evaluation and intervention. He does continue on Decadron at this time.  Hewitt Shorts, MD 02/26/2012, 10:50 AM

## 2012-02-27 NOTE — Progress Notes (Signed)
Patient ID: Kevin Galloway, male   DOB: Nov 18, 1961, 51 y.o.   MRN: [id number] Subjective: Patient reports no headache. Still some difficulty with speech...unchanged.  Objective: Vital signs in last 24 hours: Temp:  [97.6 F (36.4 C)-98.6 F (37 C)] 97.6 F (36.4 C) (03/18 0600) Pulse Rate:  [54-75] 57  (03/18 0600) Resp:  [18] 18  (03/18 0600) BP: (96-130)/(53-84) 113/73 mmHg (03/18 0600) SpO2:  [87 %-99 %] 99 % (03/18 0600)  Intake/Output from previous day: 03/17 0701 - 03/18 0700 In: 900 [I.V.:900] Out: -  Intake/Output this shift:    Neurologic: Grossly normal except mild difficulty with speech  Lab Results: Lab Results  Component Value Date   WBC 8.9 02/24/2012   HGB 16.0 02/24/2012   HCT 44.1 02/24/2012   MCV 87.2 02/24/2012   PLT 208 02/24/2012   Lab Results  Component Value Date   INR 1.00 02/24/2012   BMET Lab Results  Component Value Date   NA 136 02/24/2012   K 3.6 02/24/2012   CL 100 02/24/2012   CO2 25 02/24/2012   GLUCOSE 107* 02/24/2012   BUN 16 02/24/2012   CREATININE 0.99 02/24/2012   CALCIUM 10.1 02/24/2012    Studies/Results: No results found.  Assessment/Plan: Had long d/w he and his family re: prognosis and treatment options. Talked with Dr. Tempie Donning at [facility] who vast experience in awake procedures with brain mapping. This may be best option for [name]. Awaiting further word from dr. Tempie Donning.   LOS: 4 days    Danon Lograsso S 02/27/2012, 10:48 AM

## 2012-02-27 NOTE — Telephone Encounter (Signed)
Spoke with patient for 20 minutes. 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 Dysarthric with difficulty finding words during conversation. I will visit patient this week

## 2012-02-28 MED ORDER — DEXAMETHASONE 0.75 MG PO TABS: 4.0000 mg | ORAL_TABLET | Freq: Three times a day (TID) | ORAL | Status: AC

## 2012-02-28 NOTE — Discharge Summary (Signed)
Physician Discharge Summary  Patient ID: Kevin Galloway MRN: [id number] DOB/AGE: 1960-12-15 51 y.o.  Admit date: 02/23/2012 Discharge date: 02/28/2012  Admission Diagnoses: L temporal Brain mass    Discharge Diagnoses: same   Discharged Condition: stable  Hospital Course: The patient was admitted on 02/23/2012 with a left temporal brain mass. He has difficulty with speech, but otherwise had a non-focal exam. The hospital course was routine.  The patient remained afebrile with stable vital signs, and tolerated a regular diet. The patient continued to increase activities, and was treated with decadron. I felt he likely needed treatment at a tertiary care facility where awake surgery with spreech mapping may be performed if necessary to achieve resection (if deemed appropriate for resection), and Dr Arlan Organ has agreed to see the patient at [facility].   Consults: None  Significant Diagnostic Studies:  Results for orders placed during the hospital encounter of 02/23/12  CBC      Component Value Range   WBC 8.9  4.0 - 10.5 (K/uL)   RBC 5.06  4.22 - 5.81 (MIL/uL)   Hemoglobin 16.0  13.0 - 17.0 (g/dL)   HCT 44.1  39.0 - 52.0 (%)   MCV 87.2  78.0 - 100.0 (fL)   MCH 31.6  26.0 - 34.0 (pg)   MCHC 36.3 (*) 30.0 - 36.0 (g/dL)   RDW 12.2  11.5 - 15.5 (%)   Platelets 208  150 - 400 (K/uL)  DIFFERENTIAL      Component Value Range   Neutrophils Relative 52  43 - 77 (%)   Neutro Abs 4.6  1.7 - 7.7 (K/uL)   Lymphocytes Relative 42  12 - 46 (%)   Lymphs Abs 3.7  0.7 - 4.0 (K/uL)   Monocytes Relative 5  3 - 12 (%)   Monocytes Absolute 0.5  0.1 - 1.0 (K/uL)   Eosinophils Relative 1  0 - 5 (%)   Eosinophils Absolute 0.1  0.0 - 0.7 (K/uL)   Basophils Relative 0  0 - 1 (%)   Basophils Absolute 0.0  0.0 - 0.1 (K/uL)  BASIC METABOLIC PANEL      Component Value Range   Sodium 136  135 - 145 (mEq/L)   Potassium 3.6  3.5 - 5.1 (mEq/L)   Chloride 100  96 - 112 (mEq/L)   CO2 25  19 - 32 (mEq/L)   Glucose,  Bld 107 (*) 70 - 99 (mg/dL)   BUN 16  6 - 23 (mg/dL)   Creatinine, Ser 0.99  0.50 - 1.35 (mg/dL)   Calcium 10.1  8.4 - 10.5 (mg/dL)   GFR calc non Af Amer >90  >90 (mL/min)   GFR calc Af Amer >90  >90 (mL/min)  PROTIME-INR      Component Value Range   Prothrombin Time 13.4  11.6 - 15.2 (seconds)   INR 1.00  0.00 - 1.49     Mr Jeri Cos Wo Contrast  02/23/2012  *RADIOLOGY REPORT*  Clinical Data: Dysarthria.  MRI HEAD WITHOUT AND WITH CONTRAST  Technique:  Multiplanar, multiecho pulse sequences of the brain and surrounding structures were obtained according to standard protocol without and with intravenous contrast  Contrast:  15 ml Multihance  Comparison: None.  Findings: There is a 6 cm mass lesion in the left temporal lobe with areas of enhancement, internal necrosis and internal hemorrhage.  There is regional vasogenic edema.  There is mass effect upon the left lateral ventricle.  There is left to right shift of 8 mm.  There is uncal herniation on the left.  Findings are consistent with malignant glioma, probably glioblastoma multiforme.  No separate or satellite foci are evident.  The right hemisphere is normal.  No hydrocephalus.  No extra-axial fluid collection. No ischemic infarction.  No pituitary mass.  No inflammatory sinus disease.  IMPRESSION: 6 cm mass lesion in the left temporal lobe with mass effect, vasogenic edema, left right shift and early uncal herniation. 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  Critical Value/emergent results were called by telephone at the time of interpretation on 02/23/2012  at 2130 hours   to Dr.Richter, who verbally acknowledged these results.  Original Report Authenticated By: Jules Schick, M.D.    Antibiotics:  Anti-infectives    None      Discharge Exam: Blood pressure 124/78, pulse 91, temperature 98.3 F (36.8 C), temperature source Oral, resp. rate 18, height 5\' 11"  (1.803 m), weight 86.9 kg (191 lb 9.3 oz), SpO2 94.00%. Neurologic:  Grossly normal except for mild dyspasia  Discharge Medications:   Medication List  As of 02/28/2012 12:35 PM   TAKE these medications         acetaminophen 325 MG tablet   Commonly known as: TYLENOL   Take 650 mg by mouth every 6 (six) hours as needed. For pain      dexamethasone 0.75 MG tablet   Commonly known as: DECADRON   Take 5.5 tablets (4.125 mg total) by mouth 3 (three) times daily.      sodium chloride 0.65 % nasal spray   Commonly known as: OCEAN   Place 1 spray into the nose daily as needed. For congestion            Disposition: home   Final Dx: Brain mass  Discharge Orders    Future Orders Please Complete By Expires   Diet - low sodium heart healthy      Increase activity slowly      Driving Restrictions      Comments:   No driving   Call MD for:  temperature >100.4      Call MD for:  persistant nausea and vomiting      Call MD for:  severe uncontrolled pain      Call MD for:  difficulty breathing, headache or visual disturbances            Signed: Ayeden Gladman S 02/28/2012, 12:35 PM

## 2012-02-28 NOTE — Progress Notes (Signed)
Utilization review completed. Maleiah Dula, RN, BSN. 02/28/12  

## 2012-02-29 NOTE — Progress Notes (Signed)
CARE MANAGEMENT NOTE 02/29/2012     Action/Plan:   No Home Health needs identified   Anticipated DC Date:  02/28/2012   Anticipated DC Plan:  HOME/SELF CARE         Choice offered to / List presented to:             Status of service:  Completed, signed off  Discharge Disposition:  HOME/SELF CARE

## 2012-03-13 ENCOUNTER — Ambulatory Visit: Payer: BC Managed Care – PPO | Attending: Neurosurgery | Admitting: Physical Therapy

## 2012-03-13 ENCOUNTER — Ambulatory Visit: Payer: BC Managed Care – PPO

## 2012-03-13 ENCOUNTER — Ambulatory Visit: Payer: BC Managed Care – PPO | Admitting: Occupational Therapy

## 2012-03-13 DIAGNOSIS — R4701 Aphasia: Secondary | ICD-10-CM | POA: Insufficient documentation

## 2012-03-13 DIAGNOSIS — R4189 Other symptoms and signs involving cognitive functions and awareness: Secondary | ICD-10-CM | POA: Insufficient documentation

## 2012-03-13 DIAGNOSIS — Z5189 Encounter for other specified aftercare: Secondary | ICD-10-CM | POA: Insufficient documentation

## 2012-03-16 ENCOUNTER — Ambulatory Visit: Payer: BC Managed Care – PPO

## 2012-03-22 ENCOUNTER — Ambulatory Visit: Payer: BC Managed Care – PPO

## 2012-06-05 ENCOUNTER — Ambulatory Visit: Payer: BC Managed Care – PPO | Attending: Neurosurgery | Admitting: Speech Pathology

## 2012-06-05 DIAGNOSIS — Z5189 Encounter for other specified aftercare: Secondary | ICD-10-CM | POA: Insufficient documentation

## 2012-06-05 DIAGNOSIS — R4701 Aphasia: Secondary | ICD-10-CM | POA: Insufficient documentation

## 2012-06-05 DIAGNOSIS — R4189 Other symptoms and signs involving cognitive functions and awareness: Secondary | ICD-10-CM | POA: Insufficient documentation

## 2012-06-11 ENCOUNTER — Ambulatory Visit: Payer: BC Managed Care – PPO | Attending: Neurosurgery | Admitting: Speech Pathology

## 2012-06-11 DIAGNOSIS — R4701 Aphasia: Secondary | ICD-10-CM | POA: Insufficient documentation

## 2012-06-11 DIAGNOSIS — Z5189 Encounter for other specified aftercare: Secondary | ICD-10-CM | POA: Insufficient documentation

## 2012-06-11 DIAGNOSIS — R4189 Other symptoms and signs involving cognitive functions and awareness: Secondary | ICD-10-CM | POA: Insufficient documentation

## 2012-06-20 ENCOUNTER — Ambulatory Visit: Payer: BC Managed Care – PPO

## 2012-06-27 ENCOUNTER — Ambulatory Visit: Payer: BC Managed Care – PPO

## 2012-07-04 ENCOUNTER — Ambulatory Visit: Payer: BC Managed Care – PPO

## 2012-07-11 ENCOUNTER — Ambulatory Visit: Payer: BC Managed Care – PPO | Admitting: Speech Pathology

## 2012-07-16 ENCOUNTER — Ambulatory Visit: Payer: BC Managed Care – PPO

## 2012-07-16 ENCOUNTER — Ambulatory Visit (INDEPENDENT_AMBULATORY_CARE_PROVIDER_SITE_OTHER): Payer: BC Managed Care – PPO | Admitting: Emergency Medicine

## 2012-07-16 VITALS — BP 126/70 | HR 70 | Temp 98.0°F | Resp 18 | Ht 71.5 in | Wt 170.0 lb

## 2012-07-16 DIAGNOSIS — M25519 Pain in unspecified shoulder: Secondary | ICD-10-CM

## 2012-07-16 DIAGNOSIS — D332 Benign neoplasm of brain, unspecified: Secondary | ICD-10-CM

## 2012-07-16 DIAGNOSIS — M519 Unspecified thoracic, thoracolumbar and lumbosacral intervertebral disc disorder: Secondary | ICD-10-CM

## 2012-07-16 NOTE — Progress Notes (Signed)
  Subjective:    Patient ID: Kevin Galloway, male    DOB: October 03, 1961, 51 y.o.   MRN: [id number]  HPIPatient treatment for brain tumor. He has had surgery, radiation and chemotherapy.  He present with left peri cervical pain with severe aching in his left shoulder not associated with weakness.    Review of Systems     Objective:   Physical Exam  Constitutional: He appears well-developed.  Neck:       Tender left peri cervical area. Deep tendon reflexes are 2 plus and symmetrical  No focal weakness Shoulder exam   [facility] reading (PRIMARY) by  Dr.Ogle Hoeffner c5 c6 DDD           Assessment & Plan:  Referred to Dr. Mayford Knife

## 2012-07-18 ENCOUNTER — Ambulatory Visit: Payer: BC Managed Care – PPO | Attending: Neurosurgery | Admitting: Speech Pathology

## 2012-07-18 DIAGNOSIS — R4189 Other symptoms and signs involving cognitive functions and awareness: Secondary | ICD-10-CM | POA: Insufficient documentation

## 2012-07-18 DIAGNOSIS — R4701 Aphasia: Secondary | ICD-10-CM | POA: Insufficient documentation

## 2012-07-18 DIAGNOSIS — Z5189 Encounter for other specified aftercare: Secondary | ICD-10-CM | POA: Insufficient documentation

## 2012-07-25 ENCOUNTER — Ambulatory Visit: Payer: BC Managed Care – PPO

## 2012-08-01 ENCOUNTER — Ambulatory Visit: Payer: BC Managed Care – PPO | Admitting: Speech Pathology

## 2012-08-08 ENCOUNTER — Ambulatory Visit: Payer: BC Managed Care – PPO | Admitting: Speech Pathology

## 2012-08-15 ENCOUNTER — Ambulatory Visit: Payer: BC Managed Care – PPO | Attending: Neurosurgery

## 2012-08-15 DIAGNOSIS — Z5189 Encounter for other specified aftercare: Secondary | ICD-10-CM | POA: Insufficient documentation

## 2012-08-15 DIAGNOSIS — R4189 Other symptoms and signs involving cognitive functions and awareness: Secondary | ICD-10-CM | POA: Insufficient documentation

## 2012-08-15 DIAGNOSIS — R4701 Aphasia: Secondary | ICD-10-CM | POA: Insufficient documentation

## 2012-08-22 ENCOUNTER — Ambulatory Visit: Payer: BC Managed Care – PPO

## 2012-08-29 ENCOUNTER — Ambulatory Visit: Payer: BC Managed Care – PPO | Admitting: Speech Pathology

## 2012-09-12 ENCOUNTER — Ambulatory Visit: Payer: BC Managed Care – PPO | Attending: Neurosurgery

## 2012-09-12 DIAGNOSIS — R4701 Aphasia: Secondary | ICD-10-CM | POA: Insufficient documentation

## 2012-09-12 DIAGNOSIS — Z5189 Encounter for other specified aftercare: Secondary | ICD-10-CM | POA: Insufficient documentation

## 2012-09-12 DIAGNOSIS — R4189 Other symptoms and signs involving cognitive functions and awareness: Secondary | ICD-10-CM | POA: Insufficient documentation

## 2012-09-21 ENCOUNTER — Encounter: Payer: Self-pay | Admitting: Internal Medicine

## 2012-09-21 DIAGNOSIS — F419 Anxiety disorder, unspecified: Secondary | ICD-10-CM | POA: Insufficient documentation

## 2012-09-21 DIAGNOSIS — C719 Malignant neoplasm of brain, unspecified: Secondary | ICD-10-CM | POA: Insufficient documentation

## 2012-09-21 DIAGNOSIS — E291 Testicular hypofunction: Secondary | ICD-10-CM | POA: Insufficient documentation

## 2012-09-21 DIAGNOSIS — R4701 Aphasia: Secondary | ICD-10-CM

## 2012-09-26 ENCOUNTER — Ambulatory Visit: Payer: BC Managed Care – PPO

## 2012-10-03 ENCOUNTER — Ambulatory Visit (INDEPENDENT_AMBULATORY_CARE_PROVIDER_SITE_OTHER): Payer: BC Managed Care – PPO | Admitting: Family Medicine

## 2012-10-03 VITALS — BP 108/71 | HR 66 | Temp 98.0°F | Resp 20 | Ht 71.0 in | Wt 185.8 lb

## 2012-10-03 DIAGNOSIS — J40 Bronchitis, not specified as acute or chronic: Secondary | ICD-10-CM

## 2012-10-03 MED ORDER — AZITHROMYCIN 250 MG PO TABS: ORAL_TABLET | ORAL | Status: DC

## 2012-10-03 MED ORDER — METHYLPREDNISOLONE 4 MG PO TABS: 4.0000 mg | ORAL_TABLET | Freq: Two times a day (BID) | ORAL | Status: AC

## 2012-10-03 NOTE — Patient Instructions (Addendum)

## 2012-10-03 NOTE — Progress Notes (Signed)
@[email or web address] @   Patient ID: Kevin Galloway MRN: [id number], DOB: March 10, 1961, 51 y.o. Date of Encounter: 10/03/2012, 6:36 PM  Primary Physician: No primary provider on file.  Chief Complaint:  Chief Complaint  Patient presents with  . URI    x 1 week    HPI: 51 y.o. year old male presents with a 7 day history of nasal congestion, post nasal drip, sore throat, and cough. Mild sinus pressure. Afebrile. No chills. Nasal congestion thick and green/yellow. Cough is productive of green/yellow sputum and not associated with time of day. Ears feel full, leading to sensation of muffled hearing. Has tried OTC cold preps without success. No GI complaints.   No sick contacts, recent antibiotics, or recent travels.   No leg trauma, sedentary periods, h/o cancer, or tobacco use.  Past Medical History  Diagnosis Date  . Hypertension     "short term til he lost weight"  . GERD (gastroesophageal reflux disease)   . Headache     "related to the brain tumor"  . Kidney stones   . Expressive aphasia   . 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 Authorizing Provider  citalopram (CELEXA) 20 MG tablet Take 20 mg by mouth daily.   Yes Historical Provider, MD  fexofenadine (ALLEGRA) 180 MG tablet Take 180 mg by mouth daily.   Yes Historical Provider, MD  testosterone (ANDROGEL) 50 MG/5GM GEL Place onto the skin daily. Compound made at [facility]   Yes Historical Provider, MD  azithromycin (ZITHROMAX Z-PAK) 250 MG tablet Take as directed on pack 10/03/12   Elvina Sidle, MD  methylPREDNISolone (MEDROL) 4 MG tablet Take 1 tablet (4 mg total) by mouth 2 (two) times daily. 10/03/12   Elvina Sidle, MD    Allergies:  Allergies  Allergen Reactions  . Dimetapp ([employer]bertsons Di Bromm) Other (See Comments)    Nightmare hallucination   . Iodine Anaphylaxis and Hives    Reaction to shellfish  .  Latex Itching  . Oxycodone Other (See Comments)    Hallucinations   . Penicillins Anaphylaxis  . Shellfish Allergy Anaphylaxis and Hives  . Doxycycline Nausea And Vomiting    History   Social History  . Marital Status: Single    Spouse Name: N/A    Number of Children: N/A  . Years of Education: N/A   Occupational History  . Not on file.   Social History Main Topics  . Smoking status: Former Smoker -- 6 years    Quit date: 12/12/1988  . Smokeless tobacco: Current User    Types: Snuff, Chew  . Alcohol Use: No  . Drug Use: No  . Sexually Active: Not on file   Other Topics Concern  . Not on file   Social History Narrative  . No narrative on file     Review of Systems: Constitutional: negative for chills, fever, night sweats or weight changes Cardiovascular: negative for chest pain or palpitations Respiratory: negative for hemoptysis,  or shortness of breath Abdominal: negative for abdominal pain, nausea, vomiting or diarrhea Dermatological: negative for rash Neurologic: negative for headache   Physical Exam: Blood pressure 108/71, pulse 66, temperature 98 F (36.7 C), resp. rate 20, height 5\' 11"  (1.803 m), weight 185 lb 12.8 oz (84.278 kg), SpO2 97.00%., Body mass index is 25.91 kg/(m^2). General: Well developed, well nourished, in no acute distress. Head: Normocephalic, atraumatic,  eyes without discharge, sclera non-icteric, nares are congested. Bilateral auditory canals clear, TM's are without perforation, pearly grey with reflective cone of light bilaterally. No sinus TTP. Oral cavity moist, dentition normal. Posterior pharynx with post nasal drip and mild erythema. No peritonsillar abscess or tonsillar exudate. Neck: Supple. No thyromegaly. Full ROM. No lymphadenopathy. Lungs: Coarse breath sounds bilaterally with wheezes; no rales, or rhonchi. Breathing is unlabored.  Heart: RRR with S1 S2. No murmurs, rubs, or gallops appreciated. Msk:  Strength and tone normal  for age. Extremities: No clubbing or cyanosis. No edema. Neuro: Alert and oriented X 3. Moves all extremities spontaneously. CNII-XII grossly in tact. Psych:  Responds to questions appropriately with a normal affect.     ASSESSMENT AND PLAN:  51 y.o. year old male with bronchitis. 1. Bronchitis  azithromycin (ZITHROMAX Z-PAK) 250 MG tablet, methylPREDNISolone (MEDROL) 4 MG tablet    - -Mucinex -Tylenol/Motrin prn -Rest/fluids -RTC precautions -RTC 3-5 days if no improvement  Signed, Elvina Sidle, MD 10/03/2012 6:36 PM  2

## 2012-12-06 ENCOUNTER — Ambulatory Visit (INDEPENDENT_AMBULATORY_CARE_PROVIDER_SITE_OTHER): Payer: BC Managed Care – PPO | Admitting: Family Medicine

## 2012-12-06 VITALS — BP 121/79 | HR 87 | Temp 98.0°F | Resp 16 | Ht 72.5 in | Wt 189.6 lb

## 2012-12-06 DIAGNOSIS — J40 Bronchitis, not specified as acute or chronic: Secondary | ICD-10-CM

## 2012-12-06 MED ORDER — HYDROCODONE-HOMATROPINE 5-1.5 MG/5ML PO SYRP: 5.0000 mL | ORAL_SOLUTION | Freq: Three times a day (TID) | ORAL | Status: AC | PRN

## 2012-12-06 MED ORDER — AZITHROMYCIN 250 MG PO TABS: ORAL_TABLET | ORAL | Status: AC

## 2012-12-06 NOTE — Progress Notes (Signed)
Patient ID: JULIAN MEDINA MRN: [id number], DOB: [birth year], 51 y.o. Date of Encounter: 12/06/2012, 7:59 AM  Primary Physician: No primary provider on file.  Chief Complaint:  Chief Complaint  Patient presents with  . chest congestion  . Cough  . Sore Throat    right side    HPI: 51 y.o. year old male presents with a 5 day history of nasal congestion, post nasal drip, sore throat, and cough. Mild sinus pressure. Afebrile. No chills. Nasal congestion thick and green/yellow. Cough is productive of green/yellow sputum and not associated with time of day. Ears feel full, leading to sensation of muffled hearing. Has tried OTC cold preps without success. No GI complaints.   No sick contacts, recent antibiotics, or recent travels. He took his last dose of chemo last week and knows his WBC is low.  No leg trauma, sedentary periods, h/o cancer, or tobacco use.  Past Medical History  Diagnosis Date  . Hypertension     "short term til he lost weight"  . GERD (gastroesophageal reflux disease)   . Headache     "related to the brain tumor"  . Kidney stones   . Expressive aphasia   . 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 Authorizing Provider  citalopram (CELEXA) 20 MG tablet Take 20 mg by mouth daily.   Yes Historical Provider, MD  azithromycin (ZITHROMAX Z-PAK) 250 MG tablet Take as directed on pack 10/03/12   Elvina Sidle, MD  fexofenadine (ALLEGRA) 180 MG tablet Take 180 mg by mouth daily.    Historical Provider, MD  methylPREDNISolone (MEDROL) 4 MG tablet Take 1 tablet (4 mg total) by mouth 2 (two) times daily. 10/03/12   Elvina Sidle, MD  testosterone (ANDROGEL) 50 MG/5GM GEL Place onto the skin daily. Compound made at [facility]    Historical Provider, MD    Allergies:  Allergies  Allergen Reactions  . Dimetapp ([employer] Di Bromm) Other (See Comments)    Nightmare hallucination   . Iodine Anaphylaxis and Hives    Reaction to shellfish  . Latex Itching  . Oxycodone Other (See Comments)    Hallucinations   . Penicillins Anaphylaxis  . Shellfish Allergy Anaphylaxis and Hives  . Doxycycline Nausea And Vomiting    History   Social History  . Marital Status: Single    Spouse Name: N/A    Number of Children: N/A  . Years of Education: N/A   Occupational History  . Not on file.   Social History Main Topics  . Smoking status: Former Smoker -- 6 years    Quit date: 12/12/1988  . Smokeless tobacco: Current User    Types: Snuff, Chew  . Alcohol Use: No  . Drug Use: No  . Sexually Active: Not on file   Other Topics Concern  . Not on file   Social History Narrative  . No narrative on file     Review of Systems: Constitutional: negative for chills, fever, night sweats or weight changes Cardiovascular: negative for chest pain or palpitations Respiratory: negative for hemoptysis, wheezing, or shortness of breath Abdominal: negative for abdominal pain, nausea, vomiting or diarrhea Dermatological: negative for rash Neurologic: negative for headache   Physical Exam: Blood pressure 121/79, pulse 87, temperature 98 F (36.7 C), temperature source Oral, resp. rate 16, height 6' 0.5" (1.842 m), weight 189 lb 9.6 oz (  86.002 kg), SpO2 96.00%., Body mass index is 25.36 kg/(m^2). General: Well developed, well nourished, in no acute distress. Head: Normocephalic, atraumatic, eyes without discharge, sclera non-icteric, nares are congested. Bilateral auditory canals clear, TM's are without perforation, pearly grey with reflective cone of light bilaterally. No sinus TTP. Oral cavity moist, dentition normal. Posterior pharynx with post nasal drip and mild erythema. No peritonsillar abscess or tonsillar exudate. Neck: Supple. No thyromegaly. Full ROM. No lymphadenopathy. Lungs: Coarse breath sounds bilaterally without wheezes, rales, or rhonchi.  Breathing is unlabored.  Heart: RRR with S1 S2. No murmurs, rubs, or gallops appreciated. Msk:  Strength and tone normal for age. Extremities: No clubbing or cyanosis. No edema. Neuro: Alert and oriented X 3. Moves all extremities spontaneously. CNII-XII grossly in tact. Psych:  Responds to questions appropriately with a normal affect.    ASSESSMENT AND PLAN:  51 y.o. year old male with bronchitis.  (Survivor of a glioblastoma) - -Mucinex -Tylenol/Motrin prn -Rest/fluids -RTC precautions -RTC 3-5 days if no improvement  Signed, Elvina Sidle, MD 12/06/2012 7:59 AM

## 2012-12-06 NOTE — Patient Instructions (Addendum)

## 2012-12-31 ENCOUNTER — Ambulatory Visit (INDEPENDENT_AMBULATORY_CARE_PROVIDER_SITE_OTHER): Payer: BC Managed Care – PPO | Admitting: Internal Medicine

## 2012-12-31 VITALS — BP 115/76 | HR 98 | Temp 97.9°F | Resp 18 | Ht 71.0 in | Wt 186.6 lb

## 2012-12-31 DIAGNOSIS — J029 Acute pharyngitis, unspecified: Secondary | ICD-10-CM

## 2012-12-31 DIAGNOSIS — R05 Cough: Secondary | ICD-10-CM

## 2012-12-31 MED ORDER — AZITHROMYCIN 250 MG PO TABS: ORAL_TABLET | ORAL | Status: AC

## 2012-12-31 NOTE — Progress Notes (Signed)
  Subjective:    Patient ID: Kevin Galloway, male    DOB: Feb 16, 1961, 52 y.o.   MRN: [id number]  HPI four-day history of a sore throat with a bothersome nonproductive cough Low or no fever No nasal congestion or sinus pain Worse at night and early morning   C. recent diagnosis of glioblastoma multiform he was treatment at [facility]/nearing completion of chemotherapy and has had very good response Review of Systems     Objective:   Physical Exam Aphasia at times Vital signs stable TMs clear Nares clear Throat very red/no exudate No nodes Chest with scattered rhonchi that clear with cough       Results for orders placed in visit on 12/31/12  POCT RAPID STREP A (OFFICE)      Component Value Range   Rapid Strep A Screen Negative  Negative    Assessment & Plan:  Problem #1 sore throat and cough in a potentially immunocompromised patient  Cover for Mycoplasma with Zithromax  Meds ordered this encounter  Medications  . azithromycin (ZITHROMAX) 250 MG tablet    Sig: As packaged    Dispense:  6 tablet    Refill:  0   Has a strong cough syrup from the past

## 2013-01-03 LAB — CULTURE, GROUP A STREP: Organism ID, Bacteria: NORMAL

## 2013-01-16 ENCOUNTER — Telehealth: Payer: Self-pay | Admitting: *Deleted

## 2013-01-16 MED ORDER — EPINEPHRINE 0.3 MG/0.3ML IJ DEVI: 0.3000 mg | Freq: Once | INTRAMUSCULAR | Status: DC

## 2013-01-16 NOTE — Telephone Encounter (Signed)
[facility] [address] requesting refill on epipen, rx expired.

## 2013-01-16 NOTE — Telephone Encounter (Signed)
Refill done.  

## 2013-04-15 ENCOUNTER — Other Ambulatory Visit: Payer: Self-pay | Admitting: Physician Assistant

## 2013-05-01 DIAGNOSIS — R41841 Cognitive communication deficit: Secondary | ICD-10-CM

## 2013-05-01 DIAGNOSIS — C719 Malignant neoplasm of brain, unspecified: Secondary | ICD-10-CM

## 2013-05-01 DIAGNOSIS — R413 Other amnesia: Secondary | ICD-10-CM

## 2013-05-01 DIAGNOSIS — R41844 Frontal lobe and executive function deficit: Secondary | ICD-10-CM

## 2013-05-07 IMAGING — CR DG CERVICAL SPINE COMPLETE 4+V
2 series · 2 of 2 positions shown · non-contrast
Comparison: None

CLINICAL DATA: Neck pain

CERVICAL SPINE - COMPLETE 4+ VIEW

[lateral]
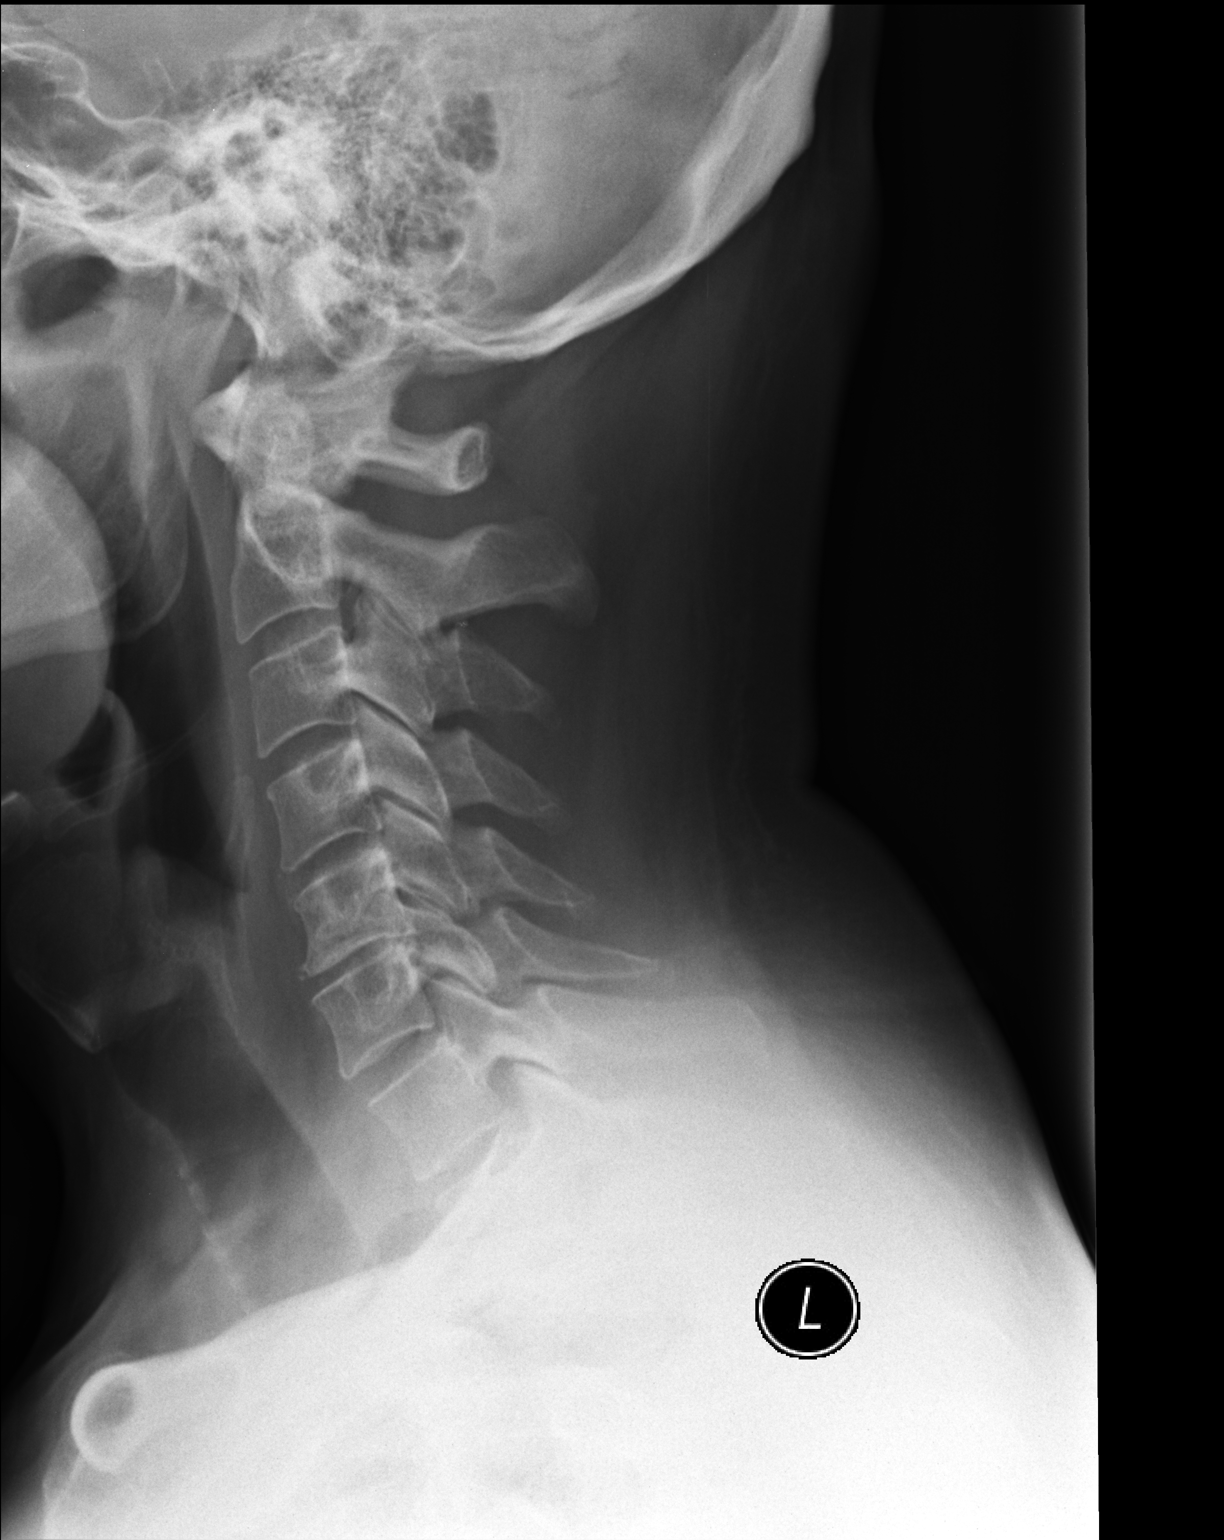

[AP]
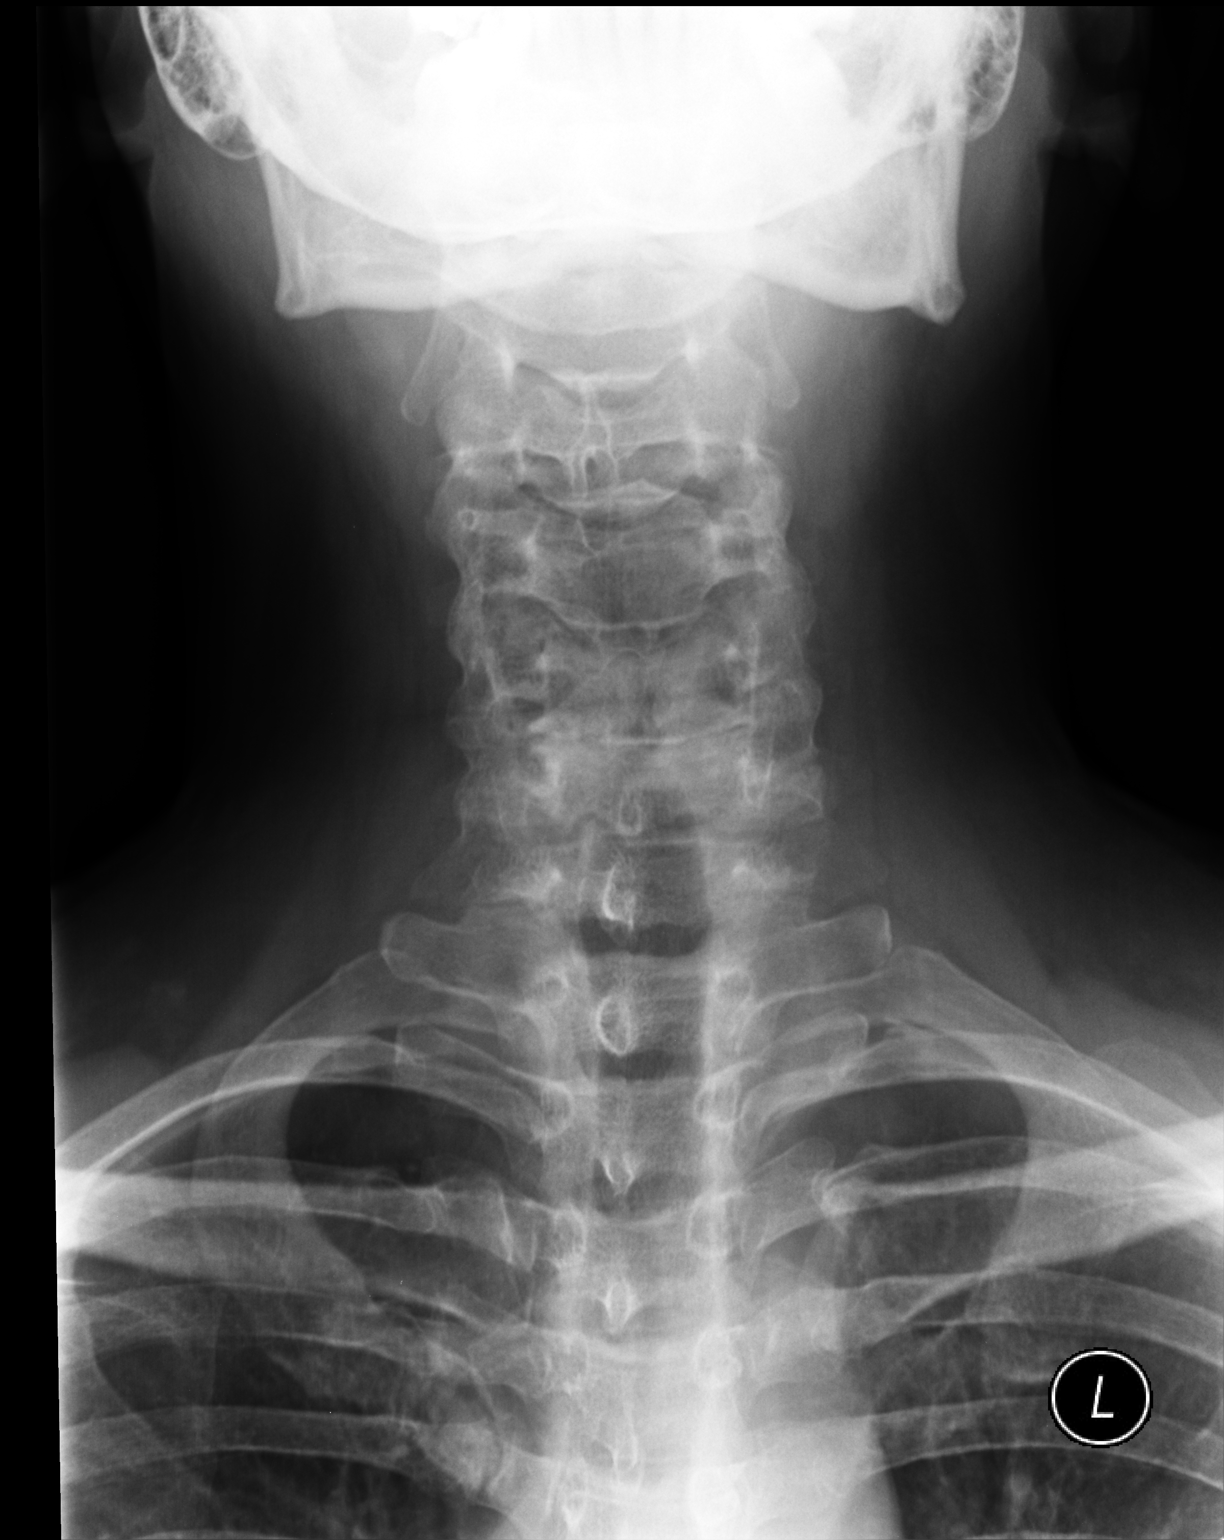

[2 of 2 positions shown; findings below may reference images not displayed]

FINDINGS: C1 to the superior endplate of T1 is imaged on the lateral
radiograph. There is approximately 2 mm retrolisthesis of C5 upon
C6.  No definite anterolisthesis.

Vertebral body heights and prevertebral soft tissues are normal.

There is mild to moderate multilevel DDD, worse at C5-C6, with disc
space height loss, end plate irregularity and small posterior disc
osteophyte complex at this level.

Limited visualization of the lung apices is normal.  Regional soft
tissues are normal.
IMPRESSION: Mild to moderate multilevel DDD, worse at C5 - C6.

## 2013-05-17 DIAGNOSIS — R413 Other amnesia: Secondary | ICD-10-CM

## 2013-05-17 DIAGNOSIS — R41844 Frontal lobe and executive function deficit: Secondary | ICD-10-CM

## 2013-05-17 DIAGNOSIS — C719 Malignant neoplasm of brain, unspecified: Secondary | ICD-10-CM

## 2013-05-17 DIAGNOSIS — R41841 Cognitive communication deficit: Secondary | ICD-10-CM

## 2014-05-12 DEATH — deceased

## 2022-07-19 ENCOUNTER — Telehealth (HOSPITAL_COMMUNITY): Payer: Self-pay | Admitting: Licensed Clinical Social Worker

## 2022-07-21 NOTE — Telephone Encounter (Signed)
The therapist attempts to reach [place] leaving a HIPAA-compliant voicemail.
